# Patient Record
Sex: Female | Born: 1970 | Race: Black or African American | Hispanic: No | Marital: Single | Smoking: Never smoker
Health system: Southern US, Community
[De-identification: ages and names within clinical notes are randomized; demographics above are authoritative.]

## PROBLEM LIST (undated history)

## (undated) DIAGNOSIS — K219 Gastro-esophageal reflux disease without esophagitis: Secondary | ICD-10-CM

## (undated) DIAGNOSIS — M1990 Unspecified osteoarthritis, unspecified site: Secondary | ICD-10-CM

## (undated) HISTORY — PX: TUBAL LIGATION: SHX77

---

## 1999-10-26 ENCOUNTER — Other Ambulatory Visit: Admission: RE | Admit: 1999-10-26 | Discharge: 1999-10-26 | Payer: Self-pay | Admitting: Obstetrics

## 1999-10-26 ENCOUNTER — Encounter (INDEPENDENT_AMBULATORY_CARE_PROVIDER_SITE_OTHER): Payer: Self-pay

## 2008-11-21 ENCOUNTER — Ambulatory Visit: Payer: Self-pay | Admitting: Radiology

## 2008-11-21 ENCOUNTER — Emergency Department (HOSPITAL_BASED_OUTPATIENT_CLINIC_OR_DEPARTMENT_OTHER): Admission: EM | Admit: 2008-11-21 | Discharge: 2008-11-21 | Payer: Self-pay | Admitting: Emergency Medicine

## 2009-04-10 ENCOUNTER — Ambulatory Visit: Payer: Self-pay | Admitting: Diagnostic Radiology

## 2009-04-10 ENCOUNTER — Emergency Department (HOSPITAL_BASED_OUTPATIENT_CLINIC_OR_DEPARTMENT_OTHER): Admission: EM | Admit: 2009-04-10 | Discharge: 2009-04-10 | Payer: Self-pay | Admitting: Emergency Medicine

## 2009-04-25 ENCOUNTER — Ambulatory Visit: Payer: Self-pay | Admitting: Diagnostic Radiology

## 2009-04-25 ENCOUNTER — Emergency Department (HOSPITAL_BASED_OUTPATIENT_CLINIC_OR_DEPARTMENT_OTHER): Admission: EM | Admit: 2009-04-25 | Discharge: 2009-04-25 | Payer: Self-pay | Admitting: Emergency Medicine

## 2010-03-30 ENCOUNTER — Emergency Department (HOSPITAL_BASED_OUTPATIENT_CLINIC_OR_DEPARTMENT_OTHER)
Admission: EM | Admit: 2010-03-30 | Discharge: 2010-03-30 | Payer: Self-pay | Source: Home / Self Care | Admitting: Emergency Medicine

## 2010-03-31 LAB — GLUCOSE, CAPILLARY: Glucose-Capillary: 271 mg/dL — ABNORMAL HIGH (ref 70–99)

## 2010-05-27 LAB — LIPASE, BLOOD: Lipase: 100 U/L (ref 23–300)

## 2010-05-27 LAB — URINE MICROSCOPIC-ADD ON

## 2010-05-27 LAB — CBC
MCHC: 34.9 g/dL (ref 30.0–36.0)
MCV: 88.7 fL (ref 78.0–100.0)
WBC: 5.5 10*3/uL (ref 4.0–10.5)

## 2010-05-27 LAB — GLUCOSE, CAPILLARY: Glucose-Capillary: 309 mg/dL — ABNORMAL HIGH (ref 70–99)

## 2010-05-27 LAB — URINALYSIS, ROUTINE W REFLEX MICROSCOPIC
Glucose, UA: >1000 mg/dL — AB
Leukocytes, UA: NEGATIVE
Specific Gravity, Urine: 1.034 — ABNORMAL HIGH (ref 1.005–1.030)
Urobilinogen, UA: 0.2 mg/dL (ref 0.0–1.0)
pH: 6.5 (ref 5.0–8.0)

## 2010-05-27 LAB — COMPREHENSIVE METABOLIC PANEL
ALT: 23 U/L (ref 0–35)
BUN: 13 mg/dL (ref 6–23)
Creatinine, Ser: 0.7 mg/dL (ref 0.4–1.2)
GFR calc Af Amer: >60 mL/min (ref >60)
GFR calc non Af Amer: >60 mL/min (ref >60)
Glucose, Bld: 399 mg/dL — ABNORMAL HIGH (ref 70–99)
Potassium: 4.3 mEq/L (ref 3.5–5.1)
Sodium: 136 mEq/L (ref 135–145)

## 2010-05-27 LAB — DIFFERENTIAL
Basophils Absolute: 0.1 10*3/uL (ref 0.0–0.1)
Basophils Relative: 2 % — ABNORMAL HIGH (ref 0–1)
Monocytes Relative: 5 % (ref 3–12)
Neutro Abs: 3.3 10*3/uL (ref 1.7–7.7)

## 2010-05-27 LAB — POCT I-STAT, CHEM 8: Glucose, Bld: 336 mg/dL — ABNORMAL HIGH (ref 70–99)

## 2010-06-12 LAB — URINALYSIS, ROUTINE W REFLEX MICROSCOPIC
Nitrite: NEGATIVE
Urobilinogen, UA: 1 mg/dL (ref 0.0–1.0)

## 2010-06-12 LAB — PREGNANCY, URINE: Preg Test, Ur: NEGATIVE

## 2010-06-12 LAB — GC/CHLAMYDIA PROBE AMP, GENITAL: Chlamydia, DNA Probe: NEGATIVE

## 2010-06-12 LAB — URINE MICROSCOPIC-ADD ON

## 2010-06-12 LAB — WET PREP, GENITAL: Yeast Wet Prep HPF POC: NONE SEEN

## 2010-07-29 ENCOUNTER — Emergency Department (HOSPITAL_BASED_OUTPATIENT_CLINIC_OR_DEPARTMENT_OTHER)
Admission: EM | Admit: 2010-07-29 | Discharge: 2010-07-29 | Disposition: A | Discharge status: Home / Self Care | Payer: Medicaid Other | Attending: Emergency Medicine | Admitting: Emergency Medicine

## 2010-07-29 DIAGNOSIS — M549 Dorsalgia, unspecified: Secondary | ICD-10-CM | POA: Insufficient documentation

## 2010-07-29 DIAGNOSIS — Z79899 Other long term (current) drug therapy: Secondary | ICD-10-CM | POA: Insufficient documentation

## 2010-07-29 DIAGNOSIS — I10 Essential (primary) hypertension: Secondary | ICD-10-CM | POA: Insufficient documentation

## 2010-07-29 DIAGNOSIS — E119 Type 2 diabetes mellitus without complications: Secondary | ICD-10-CM | POA: Insufficient documentation

## 2010-07-29 DIAGNOSIS — B373 Candidiasis of vulva and vagina: Secondary | ICD-10-CM | POA: Insufficient documentation

## 2010-07-29 DIAGNOSIS — B3731 Acute candidiasis of vulva and vagina: Secondary | ICD-10-CM | POA: Insufficient documentation

## 2010-07-29 LAB — URINALYSIS, ROUTINE W REFLEX MICROSCOPIC
Bilirubin Urine: NEGATIVE
Glucose, UA: >1000 mg/dL — AB
Leukocytes, UA: NEGATIVE
Nitrite: NEGATIVE
Specific Gravity, Urine: 1.019 (ref 1.005–1.030)

## 2010-07-29 LAB — WET PREP, GENITAL
Clue Cells Wet Prep HPF POC: NONE SEEN
Trich, Wet Prep: NONE SEEN

## 2010-07-29 LAB — URINE MICROSCOPIC-ADD ON

## 2010-07-29 LAB — PREGNANCY, URINE: Preg Test, Ur: NEGATIVE

## 2010-07-30 LAB — GC/CHLAMYDIA PROBE AMP, GENITAL: Chlamydia, DNA Probe: NEGATIVE

## 2010-08-14 ENCOUNTER — Other Ambulatory Visit: Payer: Self-pay | Admitting: Obstetrics and Gynecology

## 2010-08-14 ENCOUNTER — Other Ambulatory Visit (HOSPITAL_COMMUNITY)
Admission: RE | Admit: 2010-08-14 | Discharge: 2010-08-14 | Disposition: A | Discharge status: Home / Self Care | Payer: Medicaid Other | Source: Ambulatory Visit | Attending: Obstetrics and Gynecology | Admitting: Obstetrics and Gynecology

## 2010-08-14 DIAGNOSIS — Z01419 Encounter for gynecological examination (general) (routine) without abnormal findings: Secondary | ICD-10-CM | POA: Insufficient documentation

## 2010-10-19 ENCOUNTER — Emergency Department (HOSPITAL_BASED_OUTPATIENT_CLINIC_OR_DEPARTMENT_OTHER)
Admission: EM | Admit: 2010-10-19 | Discharge: 2010-10-19 | Disposition: A | Discharge status: Home / Self Care | Payer: Medicaid Other | Attending: Emergency Medicine | Admitting: Emergency Medicine

## 2010-10-19 ENCOUNTER — Encounter: Payer: Self-pay | Admitting: *Deleted

## 2010-10-19 DIAGNOSIS — E1169 Type 2 diabetes mellitus with other specified complication: Secondary | ICD-10-CM | POA: Insufficient documentation

## 2010-10-19 DIAGNOSIS — N938 Other specified abnormal uterine and vaginal bleeding: Secondary | ICD-10-CM | POA: Insufficient documentation

## 2010-10-19 DIAGNOSIS — I10 Essential (primary) hypertension: Secondary | ICD-10-CM | POA: Insufficient documentation

## 2010-10-19 DIAGNOSIS — R1032 Left lower quadrant pain: Secondary | ICD-10-CM | POA: Insufficient documentation

## 2010-10-19 DIAGNOSIS — N949 Unspecified condition associated with female genital organs and menstrual cycle: Secondary | ICD-10-CM | POA: Insufficient documentation

## 2010-10-19 DIAGNOSIS — Z8739 Personal history of other diseases of the musculoskeletal system and connective tissue: Secondary | ICD-10-CM | POA: Insufficient documentation

## 2010-10-19 DIAGNOSIS — R739 Hyperglycemia, unspecified: Secondary | ICD-10-CM

## 2010-10-19 HISTORY — DX: Unspecified osteoarthritis, unspecified site: M19.90

## 2010-10-19 LAB — URINALYSIS, ROUTINE W REFLEX MICROSCOPIC
Glucose, UA: >1000 mg/dL — AB
Leukocytes, UA: NEGATIVE
Nitrite: NEGATIVE
Specific Gravity, Urine: 1.036 — ABNORMAL HIGH (ref 1.005–1.030)
pH: 5.5 (ref 5.0–8.0)

## 2010-10-19 LAB — PREGNANCY, URINE: Preg Test, Ur: NEGATIVE

## 2010-10-19 LAB — WET PREP, GENITAL
Clue Cells Wet Prep HPF POC: NONE SEEN
Trich, Wet Prep: NONE SEEN

## 2010-10-19 MED ORDER — METFORMIN HCL 500 MG PO TABS
500.0000 mg | ORAL_TABLET | Freq: Once | ORAL | Status: AC
  Administered 2010-10-19: 500 mg via ORAL
  Filled 2010-10-19: qty 1

## 2010-10-19 MED ORDER — METFORMIN HCL 500 MG PO TABS: 500.0000 mg | ORAL_TABLET | Freq: Two times a day (BID) | ORAL | Status: DC

## 2010-10-19 MED ORDER — HYDROCODONE-ACETAMINOPHEN 5-325 MG PO TABS: 1.0000 | ORAL_TABLET | Freq: Four times a day (QID) | ORAL | Status: DC | PRN

## 2010-10-19 NOTE — ED Notes (Signed)
Since appx Wednesday pt has had RLQ pain ans "spotting" that she states is "different than the color of her normal cycle"

## 2010-10-19 NOTE — ED Provider Notes (Addendum)
History     CSN: [id number] Arrival date & time: 10/19/2010  3:15 AM  Chief Complaint  Patient presents with  . Abdominal Pain   HPI  40 year old female presents to the emergency department from home with complaint of right lower quadrant pain and vaginal spotting. Patient reports pain in her lower abdomen starting 5 days ago, has been diffuse to the abdomen sometimes on the left sometimes on the right. Pain it is no worse today than on other days. Patient has not taken any Tylenol or Motrin today because the pain has not been that bad. Patient reports her last menstrual cycle was the end of July, and is not due for her period for another 2 weeks. Patient reports her last period was normal. Patient denies any pregnancy symptoms, reports ligation of fallopian tubes in 1997. No fever, no vomiting, patient did have an episode of loose stools on Friday but none since. Patient denies any vaginal discharge. Patient reports history of heavy bleeding and tissue passing with her periods, is followed by Dr. Chilton Si a local OB/GYN. Patient reports normal Pap smear done in May. Patient with history of hypertension and diabetes, has been out of her medications for the last month. She denies any increased thirst, has not taken her blood sugar at home in several months. Patient is eating and drinking well, though reports yesterday she is only able to eat half a sandwich at lunch.  Past Medical History  Diagnosis Date  . Diabetes mellitus   . Arthritis    hypertension  Past Surgical History  Procedure Date  . Tubal ligation     No family history on file.  History  Substance Use Topics  . Smoking status: Never Smoker   . Smokeless tobacco: Not on file  . Alcohol Use: No    OB History    Grav Para Term Preterm Abortions TAB SAB Ect Mult Living                  Review of Systems Review of systems negative other than listed in history of present illness  Physical Exam  BP 135/80  Pulse 70   Temp(Src) 98.2 F (36.8 C) (Oral)  Resp 18  SpO2 100%  LMP 09/30/2010  Physical Exam  Constitutional: She is oriented to person, place, and time. She appears well-developed and well-nourished.       Obese  HENT:  Head: Normocephalic and atraumatic.  Eyes: Conjunctivae and EOM are normal.  Neck: Normal range of motion. Neck supple.  Cardiovascular: Normal rate, regular rhythm, normal heart sounds and intact distal pulses.   Pulmonary/Chest: Effort normal and breath sounds normal.  Abdominal: Soft. She exhibits no distension and no mass. There is tenderness. There is no rebound and no guarding.       Mild tenderness diffusely across the abdomen, slightly worse on right side versus left. No suprapubic tenderness.  Genitourinary: No vaginal discharge found.       Patient noted to have normal external genitalia, moderate amount of dark blood in vault with some tissue. Os is closed. Bimanual exam shows no tenderness in the right or left adnexa. No tenderness with palpation of the bladder. Unable to fully appreciate the uterus due to body habitus unable to palpate ovaries due to body habitus.  Neurological: She is alert and oriented to person, place, and time.  Skin: Skin is warm and dry. No rash noted. No erythema. No pallor.    ED Course  Procedures Results for orders  placed during the hospital encounter of 10/19/10  GLUCOSE, CAPILLARY      Component Value Range   Glucose-Capillary 395 (*) 70 - 99 (mg/dL)   Comment 1 Notify RN    PREGNANCY, URINE      Component Value Range   Preg Test, Ur NEGATIVE    URINALYSIS, ROUTINE W REFLEX MICROSCOPIC      Component Value Range   Color, Urine YELLOW  YELLOW    Appearance CLEAR  CLEAR    Specific Gravity, Urine 1.036 (*) 1.005 - 1.030    pH 5.5  5.0 - 8.0    Glucose, UA >1000 (*) NEGATIVE (mg/dL)   Hgb urine dipstick NEGATIVE  NEGATIVE    Bilirubin Urine NEGATIVE  NEGATIVE    Ketones, ur NEGATIVE  NEGATIVE (mg/dL)   Protein, ur NEGATIVE   NEGATIVE (mg/dL)   Urobilinogen, UA 0.2  0.0 - 1.0 (mg/dL)   Nitrite NEGATIVE  NEGATIVE    Leukocytes, UA NEGATIVE  NEGATIVE   WET PREP, GENITAL      Component Value Range   Yeast, Wet Prep NONE SEEN  NONE SEEN    Trich, Wet Prep NONE SEEN  NONE SEEN    Clue Cells, Wet Prep NONE SEEN  NONE SEEN    WBC, Wet Prep HPF POC FEW (*) NONE SEEN   URINE MICROSCOPIC-ADD ON      Component Value Range   Squamous Epithelial / LPF MANY (*) RARE    WBC, UA 0-2  <3 (WBC/hpf)   RBC / HPF 0-2  <3 (RBC/hpf)   No results found.  MDM 39 year old woman with history of diabetes and hypertension who has run out of her medications with 5 days of intermittent dull pain in her abdomen. Do not feel symptoms are due to ovarian torsion, appendicitis. Feel most likely due to early menstrual cycle, dysfunctional uterine bleeding. will check urine for possible UTI/pyelonephritis, but if negative will discharge home to followup with primary care and GYN.      Olivia Mackie, MD 10/19/10 1610  Olivia Mackie, MD 10/19/10 [phone]

## 2011-05-12 ENCOUNTER — Emergency Department (HOSPITAL_BASED_OUTPATIENT_CLINIC_OR_DEPARTMENT_OTHER)
Admission: EM | Admit: 2011-05-12 | Discharge: 2011-05-12 | Disposition: A | Discharge status: Home Health | Payer: Medicaid Other | Attending: Emergency Medicine | Admitting: Emergency Medicine

## 2011-05-12 ENCOUNTER — Emergency Department (INDEPENDENT_AMBULATORY_CARE_PROVIDER_SITE_OTHER): Payer: Medicaid Other

## 2011-05-12 ENCOUNTER — Encounter (HOSPITAL_BASED_OUTPATIENT_CLINIC_OR_DEPARTMENT_OTHER): Payer: Self-pay | Admitting: Student

## 2011-05-12 DIAGNOSIS — R110 Nausea: Secondary | ICD-10-CM | POA: Insufficient documentation

## 2011-05-12 DIAGNOSIS — R42 Dizziness and giddiness: Secondary | ICD-10-CM | POA: Insufficient documentation

## 2011-05-12 DIAGNOSIS — R51 Headache: Secondary | ICD-10-CM | POA: Insufficient documentation

## 2011-05-12 DIAGNOSIS — E119 Type 2 diabetes mellitus without complications: Secondary | ICD-10-CM | POA: Insufficient documentation

## 2011-05-12 LAB — GLUCOSE, CAPILLARY: Glucose-Capillary: 138 mg/dL — ABNORMAL HIGH (ref 70–99)

## 2011-05-12 MED ORDER — SODIUM CHLORIDE 0.9 % IV BOLUS (SEPSIS)
1000.0000 mL | Freq: Once | INTRAVENOUS | Status: AC
  Administered 2011-05-12: 1000 mL via INTRAVENOUS

## 2011-05-12 MED ORDER — METOCLOPRAMIDE HCL 5 MG/ML IJ SOLN
10.0000 mg | Freq: Once | INTRAMUSCULAR | Status: AC
  Administered 2011-05-12: 10 mg via INTRAVENOUS
  Filled 2011-05-12: qty 2

## 2011-05-12 MED ORDER — DIPHENHYDRAMINE HCL 50 MG/ML IJ SOLN
25.0000 mg | Freq: Once | INTRAMUSCULAR | Status: AC
  Administered 2011-05-12: 25 mg via INTRAVENOUS
  Filled 2011-05-12: qty 1

## 2011-05-12 MED ORDER — IBUPROFEN 600 MG PO TABS: 600.0000 mg | ORAL_TABLET | Freq: Four times a day (QID) | ORAL | Status: AC | PRN

## 2011-05-12 NOTE — ED Provider Notes (Signed)
History     CSN: [id number]  Arrival date & time 05/12/11  1651   First MD Initiated Contact with Patient 05/12/11 1709      Chief Complaint  Patient presents with  . Headache    (Consider location/radiation/quality/duration/timing/severity/associated sxs/prior treatment) HPI  H/o cervical disc disease C/o R posterior headache since awakening this morning. Describes as 7/10 at this time. Worse when bending head down. Worse with blinking.  Radiates to R face. No photo or phonophobia. Has frequent headaches over the last two week x 2 per week. +nausea. No vomiting. +lightheadedness. No new changes in vision. Denies fever/chills. Describes stiffness at base of neck only. Denies numbness/tingling/weakness in arms/legs which is new.   Mom with h/o ruptured brain aneurysm.     ED Notes, ED Provider Notes from 05/12/11 0000 to 05/12/11 17:14:22       Liliane Shi, RN 05/12/2011 17:06      Pt in with c/o pressure based headache starting in back of right side of her head and radiating to right side of face behind the right eye. Sudden onset. Denies V D CP LOC SOB but reports dizziness and nausea. Gait steady, no neuro deificits noted.     Past Medical History  Diagnosis Date  . Diabetes mellitus   . Arthritis     Past Surgical History  Procedure Date  . Tubal ligation     History reviewed. No pertinent family history.  History  Substance Use Topics  . Smoking status: Never Smoker   . Smokeless tobacco: Not on file  . Alcohol Use: No    OB History    Grav Para Term Preterm Abortions TAB SAB Ect Mult Living                  Review of Systems  All other systems reviewed and are negative.   except as noted HPI   Allergies  Lidocaine  Home Medications   Current Outpatient Rx  Name Route Sig Dispense Refill  . GABAPENTIN 300 MG PO CAPS Oral Take 300 mg by mouth 3 (three) times daily.    Marland Kitchen GLIPIZIDE-METFORMIN HCL 5-500 MG PO TABS Oral Take 1 tablet by mouth 2  (two) times daily before a meal.    . LISINOPRIL 10 MG PO TABS Oral Take 10 mg by mouth daily.      Marland Kitchen METFORMIN HCL 500 MG PO TABS Oral Take 1 tablet (500 mg total) by mouth 2 (two) times daily with a meal. 30 tablet 0  . MULTIVITAMIN PO Oral Take 1 tablet by mouth daily.    Marland Kitchen PANTOPRAZOLE SODIUM 40 MG PO TBEC Oral Take 40 mg by mouth daily.    Marland Kitchen PIOGLITAZONE HCL 15 MG PO TABS Oral Take 15 mg by mouth daily.    . IBUPROFEN 600 MG PO TABS Oral Take 1 tablet (600 mg total) by mouth every 6 (six) hours as needed for pain. 30 tablet 0    BP 105/59  Pulse 69  Temp(Src) 98.7 F (37.1 C) (Oral)  Resp 16  Wt 210 lb (95.255 kg)  SpO2 100%  LMP 05/12/2011  Physical Exam  Nursing note and vitals reviewed. Constitutional: She is oriented to person, place, and time. She appears well-developed.  HENT:  Head: Atraumatic.  Mouth/Throat: Oropharynx is clear and moist.  Eyes: Conjunctivae and EOM are normal. Pupils are equal, round, and reactive to light.  Neck: Normal range of motion. Neck supple.  Cardiovascular: Normal rate, regular rhythm, normal heart  sounds and intact distal pulses.   Pulmonary/Chest: Effort normal and breath sounds normal. No respiratory distress. She has no wheezes. She has no rales.  Abdominal: Soft. She exhibits no distension. There is no tenderness. There is no rebound and no guarding.  Musculoskeletal: Normal range of motion.  Neurological: She is alert and oriented to person, place, and time. No cranial nerve deficit. She exhibits normal muscle tone. Coordination normal.       Strength 5/5 all extremities No pronator drift No facial droop   Skin: Skin is warm and dry. No rash noted.  Psychiatric: She has a normal mood and affect.    ED Course  Procedures (including critical care time)  Labs Reviewed  GLUCOSE, CAPILLARY - Abnormal; Notable for the following:    Glucose-Capillary 138 (*)    All other components within normal limits   Ct Head Wo  Contrast  05/12/2011  *RADIOLOGY REPORT*  Clinical Data: Posterior headache and dizziness.  CT HEAD WITHOUT CONTRAST  Technique:  Contiguous axial images were obtained from the base of the skull through the vertex without contrast.  Comparison: None.  Findings: There is no acute intracranial hemorrhage, infarction, or mass lesion.  Brain parenchyma is normal.  Osseous structures are normal.  IMPRESSION: Normal exam.  Original Report Authenticated By: Gwynn Burly, M.D.    1. Headache     MDM  P/W headache, worst of life. Mother with h/o brain aneurysm. Headache improved after reglan, benadryl, IVF to 3/10 at this time. Neurologically intact. CT head unremarkable. Discussed with patient LP and patient is undecided if she would like to proceed with that procedure at this time. Will reassess.  Patient declining LP at this time. She is alert, oriented, and competent. Understands risks of leaving and inability to diagnose 1% SAH with negative CT head within 24 hours. Pain 2/10 at this time after the above interventions. Will discharge home with PMD f/u. Strict precautions for return.       Forbes Cellar, MD 05/12/11 [phone]

## 2011-05-12 NOTE — Discharge Instructions (Signed)

## 2011-05-12 NOTE — ED Notes (Signed)
Pt in with c/o pressure based headache starting in back of right side of her head and radiating to right side of face behind the right eye. Sudden onset. Denies  V D CP LOC SOB but reports dizziness and nausea. Gait steady, no neuro deificits noted.

## 2011-07-17 ENCOUNTER — Emergency Department (INDEPENDENT_AMBULATORY_CARE_PROVIDER_SITE_OTHER): Payer: Medicaid Other

## 2011-07-17 ENCOUNTER — Emergency Department (HOSPITAL_BASED_OUTPATIENT_CLINIC_OR_DEPARTMENT_OTHER)
Admission: EM | Admit: 2011-07-17 | Discharge: 2011-07-17 | Disposition: A | Discharge status: Home / Self Care | Payer: Medicaid Other | Attending: Emergency Medicine | Admitting: Emergency Medicine

## 2011-07-17 ENCOUNTER — Encounter (HOSPITAL_BASED_OUTPATIENT_CLINIC_OR_DEPARTMENT_OTHER): Payer: Self-pay | Admitting: Emergency Medicine

## 2011-07-17 DIAGNOSIS — J029 Acute pharyngitis, unspecified: Secondary | ICD-10-CM | POA: Insufficient documentation

## 2011-07-17 DIAGNOSIS — E119 Type 2 diabetes mellitus without complications: Secondary | ICD-10-CM | POA: Insufficient documentation

## 2011-07-17 DIAGNOSIS — J069 Acute upper respiratory infection, unspecified: Secondary | ICD-10-CM

## 2011-07-17 DIAGNOSIS — R05 Cough: Secondary | ICD-10-CM | POA: Insufficient documentation

## 2011-07-17 DIAGNOSIS — R509 Fever, unspecified: Secondary | ICD-10-CM | POA: Insufficient documentation

## 2011-07-17 DIAGNOSIS — R0602 Shortness of breath: Secondary | ICD-10-CM | POA: Insufficient documentation

## 2011-07-17 DIAGNOSIS — R059 Cough, unspecified: Secondary | ICD-10-CM | POA: Insufficient documentation

## 2011-07-17 DIAGNOSIS — R51 Headache: Secondary | ICD-10-CM

## 2011-07-17 DIAGNOSIS — K219 Gastro-esophageal reflux disease without esophagitis: Secondary | ICD-10-CM | POA: Insufficient documentation

## 2011-07-17 DIAGNOSIS — J3489 Other specified disorders of nose and nasal sinuses: Secondary | ICD-10-CM | POA: Insufficient documentation

## 2011-07-17 HISTORY — DX: Gastro-esophageal reflux disease without esophagitis: K21.9

## 2011-07-17 MED ORDER — ACETAMINOPHEN 325 MG PO TABS
ORAL_TABLET | ORAL | Status: AC
  Filled 2011-07-17: qty 2

## 2011-07-17 MED ORDER — OXYMETAZOLINE HCL 0.05 % NA SOLN
1.0000 | Freq: Once | NASAL | Status: AC
  Administered 2011-07-17: 1 via NASAL
  Filled 2011-07-17: qty 15

## 2011-07-17 MED ORDER — ACETAMINOPHEN 325 MG PO TABS
650.0000 mg | ORAL_TABLET | Freq: Once | ORAL | Status: AC
  Administered 2011-07-17: 650 mg via ORAL

## 2011-07-17 MED ORDER — ALBUTEROL SULFATE HFA 108 (90 BASE) MCG/ACT IN AERS
2.0000 | INHALATION_SPRAY | RESPIRATORY_TRACT | Status: DC | PRN
  Administered 2011-07-17: 2 via RESPIRATORY_TRACT
  Filled 2011-07-17: qty 6.7

## 2011-07-17 NOTE — ED Provider Notes (Addendum)
History     CSN: [id number]  Arrival date & time 07/17/11  0045   First MD Initiated Contact with Patient 07/17/11 0107      Chief Complaint  Patient presents with  . Sore Throat    (Consider location/radiation/quality/duration/timing/severity/associated sxs/prior treatment) Patient is a 41 y.o. female presenting with pharyngitis. The history is provided by the patient.  Sore Throat This is a new problem. Episode onset: 5 days ago. The problem occurs constantly. The problem has not changed since onset.Associated symptoms include shortness of breath. Pertinent negatives include no chest pain and no abdominal pain. Associated symptoms comments: Cough, fever and congestion . The symptoms are aggravated by swallowing. The symptoms are relieved by nothing. Treatments tried: OTC meds. The treatment provided no relief.    Past Medical History  Diagnosis Date  . Diabetes mellitus   . Arthritis   . Gastro-esophageal reflux     Past Surgical History  Procedure Date  . Tubal ligation     No family history on file.  History  Substance Use Topics  . Smoking status: Never Smoker   . Smokeless tobacco: Not on file  . Alcohol Use: No    OB History    Grav Para Term Preterm Abortions TAB SAB Ect Mult Living                  Review of Systems  Constitutional: Positive for fever and chills.  HENT: Positive for congestion, sore throat, rhinorrhea and sinus pressure. Negative for trouble swallowing and voice change.   Respiratory: Positive for cough, shortness of breath and wheezing.   Cardiovascular: Negative for chest pain.  Gastrointestinal: Negative for nausea and abdominal pain.  All other systems reviewed and are negative.    Allergies  Lidocaine  Home Medications   Current Outpatient Rx  Name Route Sig Dispense Refill  . GABAPENTIN 300 MG PO CAPS Oral Take 300 mg by mouth 3 (three) times daily.    Marland Kitchen GLIPIZIDE-METFORMIN HCL 5-500 MG PO TABS Oral Take 1 tablet by  mouth 2 (two) times daily before a meal.    . LISINOPRIL 10 MG PO TABS Oral Take 10 mg by mouth daily.      Marland Kitchen METFORMIN HCL 500 MG PO TABS Oral Take 1 tablet (500 mg total) by mouth 2 (two) times daily with a meal. 30 tablet 0  . MULTIVITAMIN PO Oral Take 1 tablet by mouth daily.    Marland Kitchen PANTOPRAZOLE SODIUM 40 MG PO TBEC Oral Take 40 mg by mouth daily.      BP 141/77  Pulse 94  Temp(Src) 99.3 F (37.4 C) (Oral)  Resp 22  Ht 5\' 5"  (1.651 m)  Wt 209 lb (94.802 kg)  BMI 34.78 kg/m2  SpO2 100%  LMP 07/09/2011  Physical Exam  Nursing note and vitals reviewed. Constitutional: She is oriented to person, place, and time. She appears well-developed and well-nourished. No distress.  HENT:  Head: Normocephalic and atraumatic.  Right Ear: Tympanic membrane normal.  Left Ear: Tympanic membrane normal.  Nose: Mucosal edema and rhinorrhea present.  Mouth/Throat: Posterior oropharyngeal erythema present. No oropharyngeal exudate or posterior oropharyngeal edema.       Cobblestoning in the back of pharynx consistent with drainage  Eyes: EOM are normal. Pupils are equal, round, and reactive to light.  Neck: Normal range of motion. Neck supple.  Cardiovascular: Normal rate, regular rhythm, normal heart sounds and intact distal pulses.  Exam reveals no friction rub.   No murmur heard.  Pulmonary/Chest: Effort normal and breath sounds normal. She has no wheezes. She has no rales.  Abdominal: Soft. Bowel sounds are normal. She exhibits no distension. There is no tenderness. There is no rebound and no guarding.  Musculoskeletal: Normal range of motion. She exhibits no tenderness.       No edema  Lymphadenopathy:    She has no cervical adenopathy.  Neurological: She is alert and oriented to person, place, and time. No cranial nerve deficit.  Skin: Skin is warm and dry. No rash noted.  Psychiatric: She has a normal mood and affect. Her behavior is normal.    ED Course  Procedures (including critical  care time)  Labs Reviewed - No data to display Dg Chest 2 View  07/17/2011  *RADIOLOGY REPORT*  Clinical Data: Sore throat, headache, productive cough.  CHEST - 2 VIEW  Comparison: 04/10/2009  Findings: Shallow inspiration.  Normal heart size and pulmonary vascularity.  No focal airspace consolidation in the lungs.  No blunting of costophrenic angles.  No pneumothorax. Mild degenerative changes in the thoracic spine.  No significant changes since the previous study.  IMPRESSION: No evidence of active pulmonary disease.  Original Report Authenticated By: Marlon Pel, M.D.     1. URI (upper respiratory infection)       MDM   Pt with symptoms consistent with viral URI.  Well appearing here.  No signs of breathing difficulty  No signs of pharyngitis, otitis or abnormal abdominal findings.  However pt states the productive cough is so bad at times it causes SOB.  No wheezing on exam today and no hx of smoking or asthma. CXR wnl and pt to return with any further problems.         Gwyneth Sprout, MD 07/17/11 [id number]  Gwyneth Sprout, MD 07/17/11 6295  Gwyneth Sprout, MD 07/17/11 2841

## 2011-07-17 NOTE — ED Notes (Signed)
sorethroat x5 days onset x 2 days of yellow sputum,headache,cough

## 2011-09-07 ENCOUNTER — Other Ambulatory Visit: Payer: Self-pay | Admitting: Obstetrics and Gynecology

## 2011-09-07 ENCOUNTER — Other Ambulatory Visit (HOSPITAL_COMMUNITY)
Admission: RE | Admit: 2011-09-07 | Discharge: 2011-09-07 | Disposition: A | Discharge status: Home / Self Care | Payer: Medicaid Other | Source: Ambulatory Visit | Attending: Obstetrics and Gynecology | Admitting: Obstetrics and Gynecology

## 2011-09-07 DIAGNOSIS — Z1231 Encounter for screening mammogram for malignant neoplasm of breast: Secondary | ICD-10-CM

## 2011-09-07 DIAGNOSIS — Z124 Encounter for screening for malignant neoplasm of cervix: Secondary | ICD-10-CM | POA: Insufficient documentation

## 2011-09-08 ENCOUNTER — Ambulatory Visit (HOSPITAL_BASED_OUTPATIENT_CLINIC_OR_DEPARTMENT_OTHER)
Admission: RE | Admit: 2011-09-08 | Discharge: 2011-09-08 | Disposition: A | Discharge status: Home / Self Care | Payer: Medicaid Other | Source: Ambulatory Visit | Attending: Obstetrics and Gynecology | Admitting: Obstetrics and Gynecology

## 2011-09-08 DIAGNOSIS — Z1231 Encounter for screening mammogram for malignant neoplasm of breast: Secondary | ICD-10-CM | POA: Insufficient documentation

## 2012-05-08 ENCOUNTER — Emergency Department (HOSPITAL_BASED_OUTPATIENT_CLINIC_OR_DEPARTMENT_OTHER)
Admission: EM | Admit: 2012-05-08 | Discharge: 2012-05-09 | Disposition: A | Discharge status: Home / Self Care | Payer: Medicaid Other | Attending: Emergency Medicine | Admitting: Emergency Medicine

## 2012-05-08 DIAGNOSIS — Z79899 Other long term (current) drug therapy: Secondary | ICD-10-CM | POA: Insufficient documentation

## 2012-05-08 DIAGNOSIS — K219 Gastro-esophageal reflux disease without esophagitis: Secondary | ICD-10-CM | POA: Insufficient documentation

## 2012-05-08 DIAGNOSIS — T783XXA Angioneurotic edema, initial encounter: Secondary | ICD-10-CM | POA: Insufficient documentation

## 2012-05-08 DIAGNOSIS — E119 Type 2 diabetes mellitus without complications: Secondary | ICD-10-CM | POA: Insufficient documentation

## 2012-05-08 DIAGNOSIS — T464X5A Adverse effect of angiotensin-converting-enzyme inhibitors, initial encounter: Secondary | ICD-10-CM

## 2012-05-08 DIAGNOSIS — T498X5A Adverse effect of other topical agents, initial encounter: Secondary | ICD-10-CM | POA: Insufficient documentation

## 2012-05-08 DIAGNOSIS — M129 Arthropathy, unspecified: Secondary | ICD-10-CM | POA: Insufficient documentation

## 2012-05-08 DIAGNOSIS — Z791 Long term (current) use of non-steroidal anti-inflammatories (NSAID): Secondary | ICD-10-CM | POA: Insufficient documentation

## 2012-05-08 DIAGNOSIS — R220 Localized swelling, mass and lump, head: Secondary | ICD-10-CM | POA: Insufficient documentation

## 2012-05-09 ENCOUNTER — Encounter (HOSPITAL_BASED_OUTPATIENT_CLINIC_OR_DEPARTMENT_OTHER): Payer: Self-pay | Admitting: *Deleted

## 2012-05-09 MED ORDER — PREDNISONE 20 MG PO TABS
40.0000 mg | ORAL_TABLET | Freq: Once | ORAL | Status: AC
  Administered 2012-05-09: 40 mg via ORAL
  Filled 2012-05-09: qty 2

## 2012-05-09 MED ORDER — PREDNISONE 10 MG PO TABS: 20.0000 mg | ORAL_TABLET | Freq: Two times a day (BID) | ORAL | Status: DC

## 2012-05-09 NOTE — ED Provider Notes (Signed)
History     CSN: [id number]  Arrival date & time 05/08/12  2345   First MD Initiated Contact with Patient 05/09/12 0010      Chief Complaint  Patient presents with  . Pruritis    (Consider location/radiation/quality/duration/timing/severity/associated sxs/prior treatment) HPI Comments: Patient with 12 hour history of swelling of the lips and itching all over.  She denies any new exposures or contacts.  She is on an ace inhibitor which was recently re-started after she had been on it for a while.  No airway problems or difficulty breathing.  No rash or hives.  No chest pain or shortness of breath.  No other aggravating or alleviating factors.  The history is provided by the patient.    Past Medical History  Diagnosis Date  . Diabetes mellitus   . Arthritis   . Gastro-esophageal reflux     Past Surgical History  Procedure Laterality Date  . Tubal ligation      History reviewed. No pertinent family history.  History  Substance Use Topics  . Smoking status: Never Smoker   . Smokeless tobacco: Not on file  . Alcohol Use: No    OB History   Grav Para Term Preterm Abortions TAB SAB Ect Mult Living                  Review of Systems  HENT:       Lip swelling   Skin:       itching  All other systems reviewed and are negative.    Allergies  Lidocaine  Home Medications   Current Outpatient Rx  Name  Route  Sig  Dispense  Refill  . carisoprodol (SOMA) 350 MG tablet   Oral   Take 350 mg by mouth 3 (three) times daily as needed.         . gabapentin (NEURONTIN) 300 MG capsule   Oral   Take 300 mg by mouth 2 (two) times daily before lunch and supper.          Marland Kitchen glipiZIDE-metformin (METAGLIP) 5-500 MG per tablet   Oral   Take 2 tablets by mouth 2 (two) times daily before a meal.          . lisinopril (PRINIVIL,ZESTRIL) 10 MG tablet   Oral   Take 5 mg by mouth daily.          Marland Kitchen EXPIRED: metFORMIN (GLUCOPHAGE) 500 MG tablet   Oral   Take 1 tablet  (500 mg total) by mouth 2 (two) times daily with a meal.   30 tablet   0   . metoprolol succinate (TOPROL-XL) 25 MG 24 hr tablet   Oral   Take 25 mg by mouth daily.         . Multiple Vitamins-Minerals (MULTIVITAMIN PO)   Oral   Take 1 tablet by mouth daily.         . naproxen (NAPROSYN) 500 MG tablet   Oral   Take 500 mg by mouth 2 (two) times daily with a meal.         . nortriptyline (PAMELOR) 25 MG capsule   Oral   Take 25 mg by mouth at bedtime.         . pantoprazole (PROTONIX) 40 MG tablet   Oral   Take 40 mg by mouth daily.           BP 132/83  Pulse 83  Temp(Src) 97.9 F (36.6 C) (Oral)  Resp 20  Ht  5\' 6"  (1.676 m)  Wt 215 lb (97.523 kg)  BMI 34.72 kg/m2  SpO2 100%  Physical Exam  Nursing note and vitals reviewed. Constitutional: She is oriented to person, place, and time. She appears well-developed and well-nourished. No distress.  HENT:  Head: Normocephalic and atraumatic.  Mouth/Throat: Oropharynx is clear and moist.  There is mild swelling of both upper and lower lips.  No tongue swelling or difficulty swallowing.  Neck: Normal range of motion. Neck supple.  Cardiovascular: Normal rate and regular rhythm.   No murmur heard. Pulmonary/Chest: Effort normal and breath sounds normal. No respiratory distress.  No stridor.  Abdominal: Soft. Bowel sounds are normal.  Musculoskeletal: Normal range of motion. She exhibits no edema.  Neurological: She is alert and oriented to person, place, and time.  Skin: Skin is warm and dry. She is not diaphoretic.    ED Course  Procedures (including critical care time)  Labs Reviewed - No data to display No results found.   No diagnosis found.    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  Will treat with steroids and antihistamines.  She is also to hold her ace inhibitor and track her blood pressures over the next week.  She has an appointment with her pcp in 4 days and can discuss her  medications then.        Geoffery Lyons, MD 05/09/12 [phone]

## 2012-05-09 NOTE — ED Notes (Signed)
Onset of itching this am  Lip started swelling tonight.  Denies difficulty swallowing or breathing

## 2012-08-29 ENCOUNTER — Ambulatory Visit: Payer: Self-pay | Admitting: Podiatry

## 2012-09-05 ENCOUNTER — Ambulatory Visit (INDEPENDENT_AMBULATORY_CARE_PROVIDER_SITE_OTHER): Payer: Self-pay | Admitting: Podiatry

## 2012-09-05 DIAGNOSIS — B351 Tinea unguium: Secondary | ICD-10-CM | POA: Insufficient documentation

## 2012-09-05 DIAGNOSIS — M79609 Pain in unspecified limb: Secondary | ICD-10-CM

## 2012-09-05 NOTE — Progress Notes (Signed)
42 year old NIDDM presents for diabetic foot care. Blood sugar has been good and has reduced her medication.  Stated that she has used antifungal drops for a few months and now using OTC drops.  Objective: Dystrophic nail and hypertrophic on both great toe nails. No other deformity. All pedal pulses palpable. No skin lesions.  Assessment: Onychomycosis both great toe nails. NIDDM.  Plan: Continue using antifungal drops. Debrided all nails.

## 2012-12-13 ENCOUNTER — Ambulatory Visit (INDEPENDENT_AMBULATORY_CARE_PROVIDER_SITE_OTHER): Payer: Medicaid Other | Admitting: Podiatry

## 2012-12-13 ENCOUNTER — Encounter: Payer: Self-pay | Admitting: Podiatry

## 2012-12-13 VITALS — BP 110/63 | HR 71 | Ht 66.0 in | Wt 198.0 lb

## 2012-12-13 DIAGNOSIS — M79609 Pain in unspecified limb: Secondary | ICD-10-CM

## 2012-12-13 DIAGNOSIS — B351 Tinea unguium: Secondary | ICD-10-CM

## 2012-12-13 DIAGNOSIS — M79673 Pain in unspecified foot: Secondary | ICD-10-CM | POA: Insufficient documentation

## 2012-12-13 MED ORDER — TERBINAFINE HCL 250 MG PO TABS: 250.0000 mg | ORAL_TABLET | Freq: Every day | ORAL | Status: DC

## 2012-12-13 NOTE — Progress Notes (Signed)
Subjective: 42 year old NIDDM presents for diabetic foot care.  Stated that she has used antifungal drops for a few months and now using OTC drops. She has not seen any changes in her nails.  Objective: Dystrophic nail and hypertrophic on both great toe nails.  No other deformity.  All pedal pulses palpable.  No skin lesions.   Assessment: Onychomycosis both great toe nails.  NIDDM.   Plan: Will try oral antifungal medications. Debrided all nails.

## 2012-12-13 NOTE — Patient Instructions (Signed)
Seen for fungal nails. Seen no improvement or change in nails after OTC topical. Will try oral medication. Please do blood work as soon as start taking the pills.

## 2013-02-16 IMAGING — CR DG CHEST 2V
2 series · 2 of 2 positions shown · non-contrast
Comparison: 04/10/2009

CLINICAL DATA: Sore throat, headache, productive cough.

CHEST - 2 VIEW

[w chest pa]
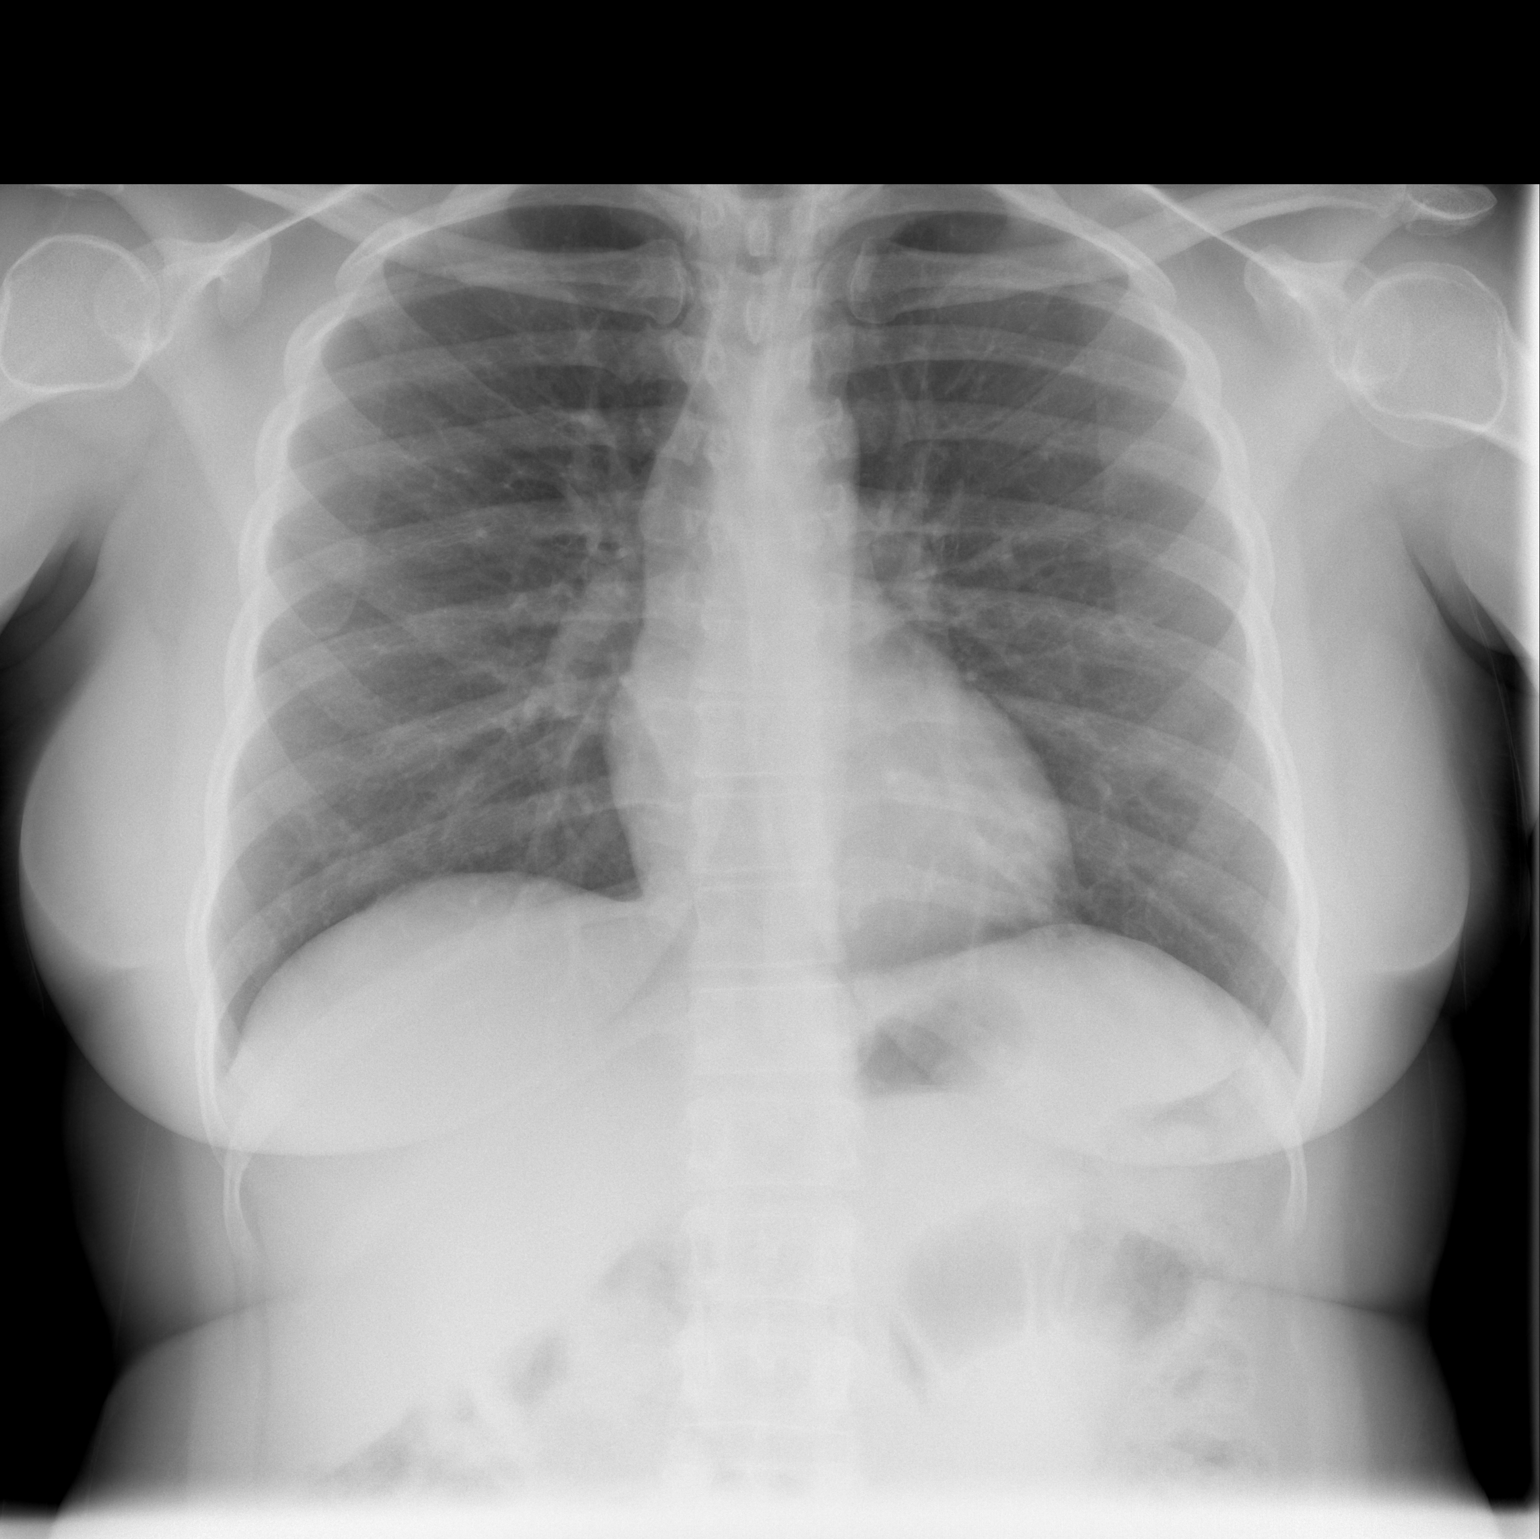

[w chest lat]
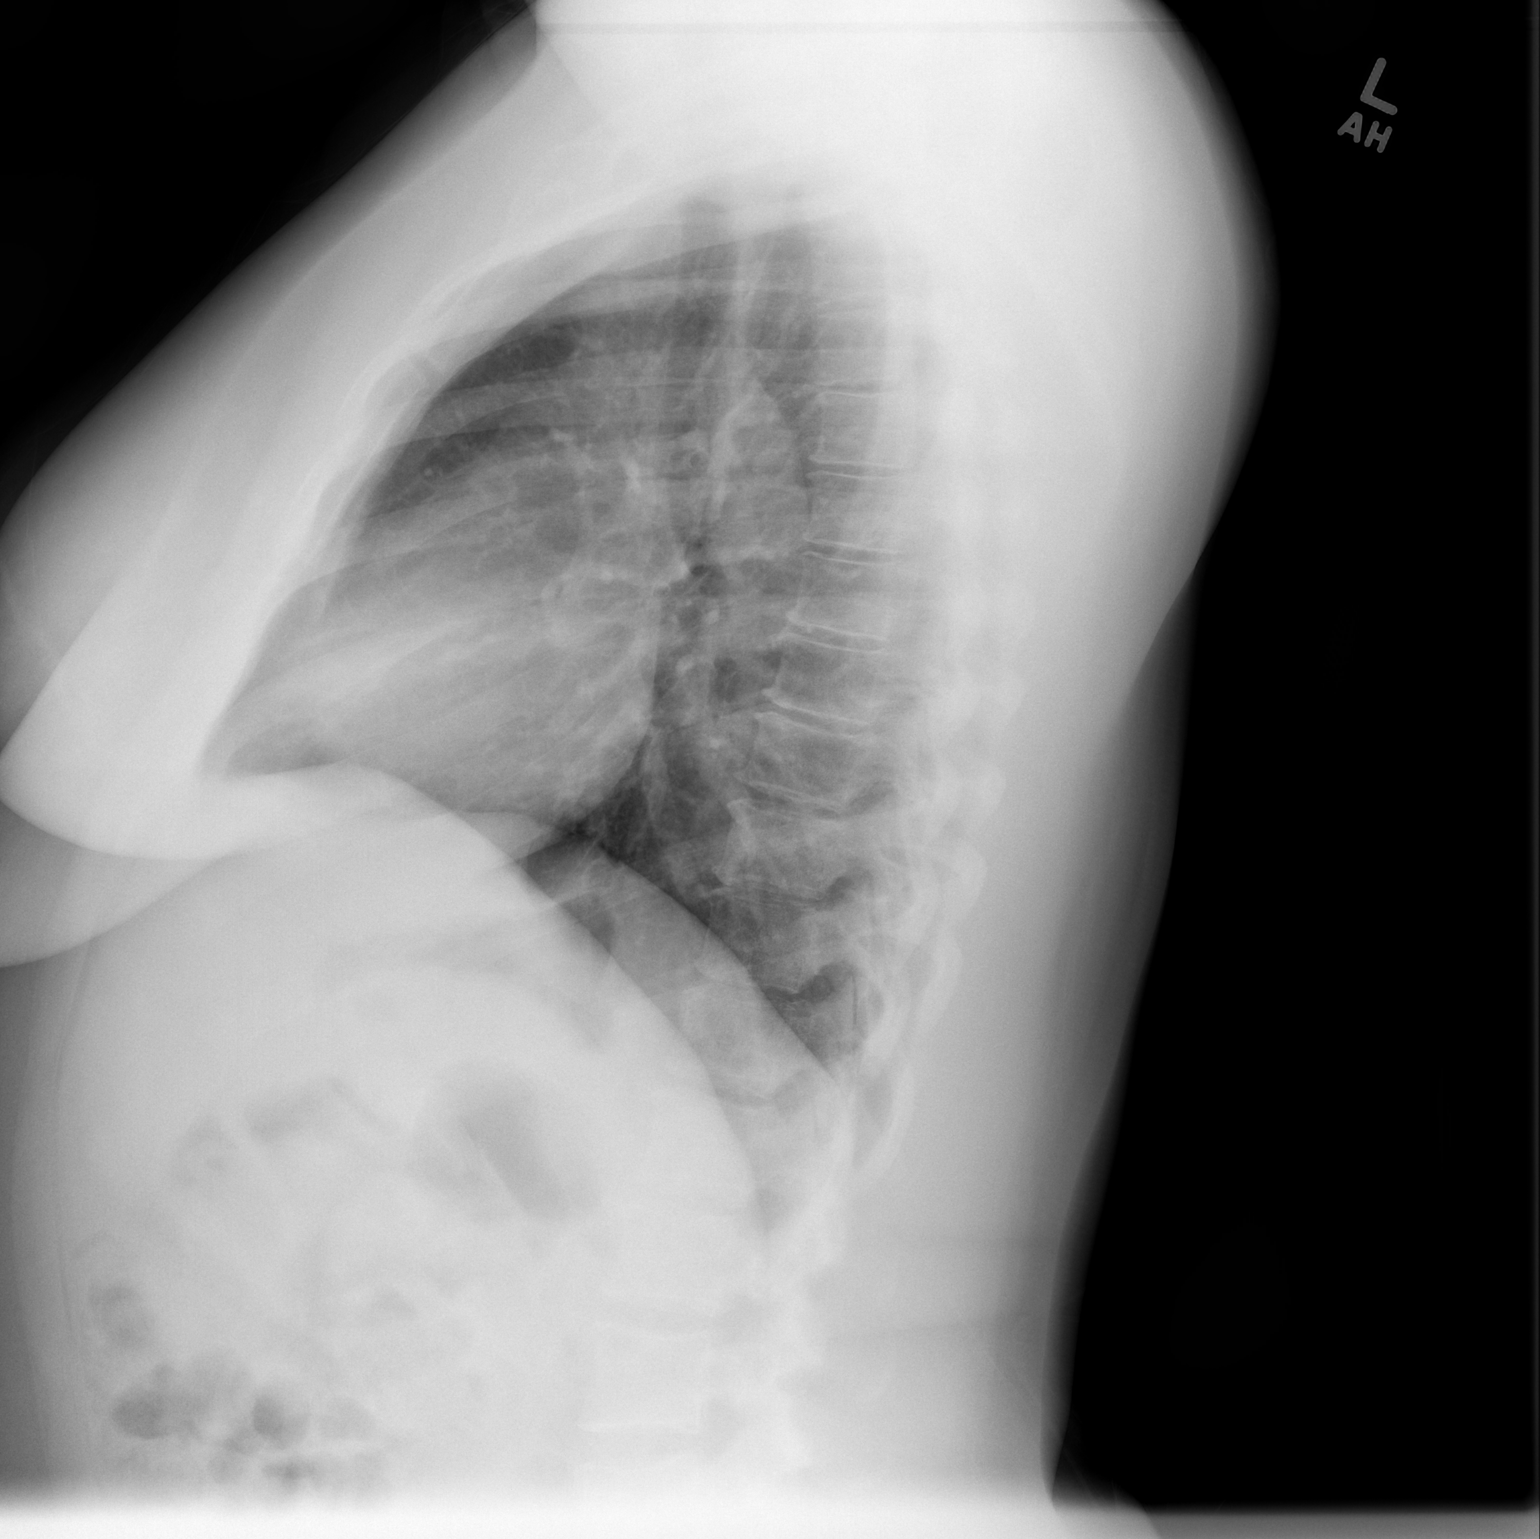

[2 of 2 positions shown; findings below may reference images not displayed]

FINDINGS: Shallow inspiration.  Normal heart size and pulmonary
vascularity.  No focal airspace consolidation in the lungs.  No
blunting of costophrenic angles.  No pneumothorax. Mild
degenerative changes in the thoracic spine.  No significant changes
since the previous study.
IMPRESSION: No evidence of active pulmonary disease.

## 2013-03-16 ENCOUNTER — Encounter: Payer: Self-pay | Admitting: Podiatry

## 2013-03-16 ENCOUNTER — Ambulatory Visit (INDEPENDENT_AMBULATORY_CARE_PROVIDER_SITE_OTHER): Payer: Medicaid Other | Admitting: Podiatry

## 2013-03-16 VITALS — BP 130/74 | HR 75

## 2013-03-16 DIAGNOSIS — M79609 Pain in unspecified limb: Secondary | ICD-10-CM

## 2013-03-16 DIAGNOSIS — B351 Tinea unguium: Secondary | ICD-10-CM

## 2013-03-16 NOTE — Patient Instructions (Signed)
Seen for hypertrophic nails. Continue Lamisil for another month. Will start on Topical next month.  All nails debrided. Return in 2 months.

## 2013-03-16 NOTE — Progress Notes (Signed)
Subjective:  43 year old NIDDM presents for diabetic foot care.  Has taken Lamisil last 2 months.  Stated that she has used antifungal drops for a few months and now using OTC drops. She has not seen any changes in her nails.  Objective: Dystrophic nail and hypertrophic on both great toe nails.  No other deformity.  All pedal pulses palpable.  No skin lesions.  Assessment: Onychomycosis both great toe nails.  NIDDM.  Plan: Will follow with topical antifungal medication when finished with Lamisil next month.   Debrided all nails

## 2013-04-10 ENCOUNTER — Other Ambulatory Visit: Payer: Self-pay | Admitting: Podiatry

## 2013-05-14 ENCOUNTER — Ambulatory Visit (INDEPENDENT_AMBULATORY_CARE_PROVIDER_SITE_OTHER): Payer: Medicaid Other | Admitting: Podiatry

## 2013-05-14 ENCOUNTER — Encounter: Payer: Self-pay | Admitting: Podiatry

## 2013-05-14 VITALS — BP 122/79 | HR 75

## 2013-05-14 DIAGNOSIS — M79673 Pain in unspecified foot: Secondary | ICD-10-CM

## 2013-05-14 DIAGNOSIS — B351 Tinea unguium: Secondary | ICD-10-CM

## 2013-05-14 DIAGNOSIS — M79609 Pain in unspecified limb: Secondary | ICD-10-CM

## 2013-05-14 NOTE — Progress Notes (Signed)
Been on Lamisil for one month. Sees nails are improving with treatment.   Assessment: Mycotic nails x 10.   Plan:  Blood drawn for liver enzyme profile.  Nails debrided. Return in 6 weeks.

## 2013-05-14 NOTE — Patient Instructions (Signed)
Follow up on fungal nail. Improving nail. Continue to take Lamisil for 2 more months.  Will inform you if blood work is abnormal.  Return in 6 weeks.

## 2013-05-15 ENCOUNTER — Other Ambulatory Visit: Payer: Self-pay | Admitting: Podiatry

## 2013-06-25 ENCOUNTER — Ambulatory Visit (INDEPENDENT_AMBULATORY_CARE_PROVIDER_SITE_OTHER): Payer: Medicaid Other | Admitting: Podiatry

## 2013-06-25 ENCOUNTER — Encounter: Payer: Self-pay | Admitting: Podiatry

## 2013-06-25 VITALS — BP 133/95 | HR 83

## 2013-06-25 DIAGNOSIS — M79673 Pain in unspecified foot: Secondary | ICD-10-CM

## 2013-06-25 DIAGNOSIS — B351 Tinea unguium: Secondary | ICD-10-CM

## 2013-06-25 DIAGNOSIS — M79609 Pain in unspecified limb: Secondary | ICD-10-CM

## 2013-06-25 NOTE — Patient Instructions (Signed)
Blood work repeated for pre medication, Lamisil.

## 2013-06-25 NOTE — Progress Notes (Signed)
Follow up on fungal nail treatment. She is taking Lamisil 250 mg daily. She feels she can tell improvement. Nails are mildly hypertrophic. Today blood work repeated for Liver profile.  Blood drawn to repeat liver enzyme level.  Will debride nails next visit.

## 2013-06-28 ENCOUNTER — Other Ambulatory Visit: Payer: Self-pay | Admitting: *Deleted

## 2013-07-23 ENCOUNTER — Other Ambulatory Visit: Payer: Self-pay | Admitting: *Deleted

## 2013-07-25 ENCOUNTER — Ambulatory Visit (INDEPENDENT_AMBULATORY_CARE_PROVIDER_SITE_OTHER): Payer: Medicaid Other | Admitting: Podiatry

## 2013-07-25 ENCOUNTER — Encounter: Payer: Self-pay | Admitting: Podiatry

## 2013-07-25 VITALS — BP 130/74 | HR 89

## 2013-07-25 DIAGNOSIS — M79609 Pain in unspecified limb: Secondary | ICD-10-CM

## 2013-07-25 DIAGNOSIS — B351 Tinea unguium: Secondary | ICD-10-CM

## 2013-07-25 DIAGNOSIS — M79673 Pain in unspecified foot: Secondary | ICD-10-CM

## 2013-07-25 NOTE — Progress Notes (Signed)
Subjective:  43 year old female presents for follow up on fungal nail treatment.  Still taking Lamisil. Almost done with 3 months course. Patient acknowledge improved toe nails.   Objective: Improving nail plate evident in both great toe nails showing clear thin new nail growth at proximal 3/4. No other problems or issues at this time.  Assessment: Improving onychomycosis with Lamisil PO treatment. Distal 1/4 of nail plate thick with yellow debris on both great toes.  Plan: All nails debrided. Instructed to finish Lamisil and follow with OTC antifungal drops.  Return in 3 month.

## 2013-07-25 NOTE — Patient Instructions (Signed)
Improved fungal nail with Lamisil. Finish Lamisil and follow with Topical antifungal medication. Return in 3 months.

## 2013-10-26 ENCOUNTER — Ambulatory Visit: Payer: Medicaid Other | Admitting: Podiatry

## 2013-11-09 ENCOUNTER — Emergency Department (HOSPITAL_BASED_OUTPATIENT_CLINIC_OR_DEPARTMENT_OTHER)
Admission: EM | Admit: 2013-11-09 | Discharge: 2013-11-09 | Disposition: A | Discharge status: Home / Self Care | Payer: Medicaid Other | Attending: Emergency Medicine | Admitting: Emergency Medicine

## 2013-11-09 ENCOUNTER — Encounter (HOSPITAL_BASED_OUTPATIENT_CLINIC_OR_DEPARTMENT_OTHER): Payer: Self-pay | Admitting: Emergency Medicine

## 2013-11-09 DIAGNOSIS — Z3202 Encounter for pregnancy test, result negative: Secondary | ICD-10-CM | POA: Insufficient documentation

## 2013-11-09 DIAGNOSIS — R109 Unspecified abdominal pain: Secondary | ICD-10-CM | POA: Diagnosis present

## 2013-11-09 DIAGNOSIS — Z9851 Tubal ligation status: Secondary | ICD-10-CM | POA: Diagnosis not present

## 2013-11-09 DIAGNOSIS — K219 Gastro-esophageal reflux disease without esophagitis: Secondary | ICD-10-CM | POA: Diagnosis not present

## 2013-11-09 DIAGNOSIS — Z8739 Personal history of other diseases of the musculoskeletal system and connective tissue: Secondary | ICD-10-CM | POA: Insufficient documentation

## 2013-11-09 DIAGNOSIS — Z79899 Other long term (current) drug therapy: Secondary | ICD-10-CM | POA: Insufficient documentation

## 2013-11-09 DIAGNOSIS — E119 Type 2 diabetes mellitus without complications: Secondary | ICD-10-CM | POA: Diagnosis not present

## 2013-11-09 LAB — CBC WITH DIFFERENTIAL/PLATELET
Basophils Absolute: 0 10*3/uL (ref 0.0–0.1)
Basophils Relative: 0 % (ref 0–1)
Eosinophils Absolute: 0.1 10*3/uL (ref 0.0–0.7)
Eosinophils Relative: 1 % (ref 0–5)
HCT: 37.5 % (ref 36.0–46.0)
HEMOGLOBIN: 12.9 g/dL (ref 12.0–15.0)
LYMPHS ABS: 2.5 10*3/uL (ref 0.7–4.0)
LYMPHS PCT: 34 % (ref 12–46)
MCH: 30 pg (ref 26.0–34.0)
MCHC: 34.4 g/dL (ref 30.0–36.0)
MCV: 87.2 fL (ref 78.0–100.0)
MONOS PCT: 6 % (ref 3–12)
Monocytes Absolute: 0.4 10*3/uL (ref 0.1–1.0)
NEUTROS ABS: 4.4 10*3/uL (ref 1.7–7.7)
NEUTROS PCT: 59 % (ref 43–77)
PLATELETS: 306 10*3/uL (ref 150–400)
RBC: 4.3 MIL/uL (ref 3.87–5.11)
RDW: 12 % (ref 11.5–15.5)
WBC: 7.4 10*3/uL (ref 4.0–10.5)

## 2013-11-09 LAB — URINALYSIS, ROUTINE W REFLEX MICROSCOPIC
BILIRUBIN URINE: NEGATIVE
GLUCOSE, UA: 100 mg/dL — AB
Ketones, ur: NEGATIVE mg/dL
Leukocytes, UA: NEGATIVE
Nitrite: NEGATIVE
PH: 6.5 (ref 5.0–8.0)
Protein, ur: NEGATIVE mg/dL
SPECIFIC GRAVITY, URINE: 1.01 (ref 1.005–1.030)
Urobilinogen, UA: 0.2 mg/dL (ref 0.0–1.0)

## 2013-11-09 LAB — COMPREHENSIVE METABOLIC PANEL
ALBUMIN: 4 g/dL (ref 3.5–5.2)
ALK PHOS: 68 U/L (ref 39–117)
ALT: 11 U/L (ref 0–35)
AST: 17 U/L (ref 0–37)
Anion gap: 13 (ref 5–15)
BUN: 13 mg/dL (ref 6–23)
CHLORIDE: 100 meq/L (ref 96–112)
CO2: 26 mEq/L (ref 19–32)
Calcium: 11.1 mg/dL — ABNORMAL HIGH (ref 8.4–10.5)
Creatinine, Ser: 0.7 mg/dL (ref 0.50–1.10)
GFR calc Af Amer: >90 mL/min (ref >90)
GFR calc non Af Amer: >90 mL/min (ref >90)
Glucose, Bld: 217 mg/dL — ABNORMAL HIGH (ref 70–99)
POTASSIUM: 4.4 meq/L (ref 3.7–5.3)
Sodium: 139 mEq/L (ref 137–147)
TOTAL PROTEIN: 7.1 g/dL (ref 6.0–8.3)
Total Bilirubin: 0.4 mg/dL (ref 0.3–1.2)

## 2013-11-09 LAB — URINE MICROSCOPIC-ADD ON

## 2013-11-09 LAB — PREGNANCY, URINE: PREG TEST UR: NEGATIVE

## 2013-11-09 LAB — LIPASE, BLOOD: Lipase: 30 U/L (ref 11–59)

## 2013-11-09 MED ORDER — PB-HYOSCY-ATROPINE-SCOPOLAMINE 16.2 MG/5ML PO ELIX
10.0000 mL | ORAL_SOLUTION | Freq: Once | ORAL | Status: DC
  Filled 2013-11-09: qty 10

## 2013-11-09 MED ORDER — ESOMEPRAZOLE MAGNESIUM 40 MG PO CPDR: 40.0000 mg | DELAYED_RELEASE_CAPSULE | Freq: Every day | ORAL | Status: AC

## 2013-11-09 MED ORDER — PANTOPRAZOLE SODIUM 40 MG IV SOLR
40.0000 mg | Freq: Once | INTRAVENOUS | Status: AC
  Administered 2013-11-09: 40 mg via INTRAVENOUS
  Filled 2013-11-09: qty 40

## 2013-11-09 MED ORDER — ONDANSETRON HCL 4 MG/2ML IJ SOLN
4.0000 mg | Freq: Once | INTRAMUSCULAR | Status: AC
  Administered 2013-11-09: 4 mg via INTRAVENOUS
  Filled 2013-11-09: qty 2

## 2013-11-09 MED ORDER — ALUM & MAG HYDROXIDE-SIMETH 200-200-20 MG/5ML PO SUSP
30.0000 mL | Freq: Once | ORAL | Status: AC
  Administered 2013-11-09: 30 mL via ORAL
  Filled 2013-11-09: qty 30

## 2013-11-09 MED ORDER — MORPHINE SULFATE 4 MG/ML IJ SOLN
4.0000 mg | Freq: Once | INTRAMUSCULAR | Status: AC
  Administered 2013-11-09: 4 mg via INTRAVENOUS
  Filled 2013-11-09: qty 1

## 2013-11-09 NOTE — ED Notes (Signed)
Water given to pt for po challenge.

## 2013-11-09 NOTE — ED Provider Notes (Signed)
CSN: [id number]     Arrival date & time 11/09/13  0917 History   First MD Initiated Contact with Patient 11/09/13 [phone]     Chief Complaint  Patient presents with  . Abdominal Pain     (Consider location/radiation/quality/duration/timing/severity/associated sxs/prior Treatment) HPI  This is a 43 year old female who presents with abdominal pain. History of diabetes and reflux. Patient reports onset of symptoms yesterday after eating. Pain is described as burning.  It is mostly in the epigastrium and left upper part her. It does radiate into her chest. Current pain is 8/10. She has associated nausea without vomiting. She had a bowel movement approximately 2 hours ago. She states that this is somewhat consistent with her prior episodes of reflux. Denies any urinary symptoms, fevers, chest pain, or shortness of breath.  Past Medical History  Diagnosis Date  . Diabetes mellitus   . Arthritis   . Gastro-esophageal reflux    Past Surgical History  Procedure Laterality Date  . Tubal ligation     No family history on file. History  Substance Use Topics  . Smoking status: Never Smoker   . Smokeless tobacco: Never Used  . Alcohol Use: No   OB History   Grav Para Term Preterm Abortions TAB SAB Ect Mult Living                 Review of Systems  Constitutional: Negative for fever.  Respiratory: Negative for chest tightness and shortness of breath.   Cardiovascular: Negative for chest pain.  Gastrointestinal: Positive for nausea and abdominal pain. Negative for vomiting.  Genitourinary: Negative for dysuria.  Neurological: Negative for headaches.  All other systems reviewed and are negative.     Allergies  Lidocaine  Home Medications   Prior to Admission medications   Medication Sig Start Date End Date Taking? Authorizing Provider  Canagliflozin (INVOKANA) 300 MG TABS Take by mouth daily.    Historical Provider, MD  esomeprazole (NEXIUM) 40 MG capsule Take 1 capsule (40 mg  total) by mouth daily. 11/09/13   Shon Baton, MD  glipiZIDE-metformin (METAGLIP) 5-500 MG per tablet Take 2 tablets by mouth 2 (two) times daily before a meal.     Historical Provider, MD  losartan (COZAAR) 25 MG tablet Take 25 mg by mouth daily.    Historical Provider, MD  metoprolol succinate (TOPROL-XL) 25 MG 24 hr tablet Take 25 mg by mouth daily.    Historical Provider, MD  terbinafine (LAMISIL) 250 MG tablet TAKE 1 TABLET BY MOUTH EVERY DAY 04/10/13   Myeong Sheard, DPM   BP 134/84  Pulse 64  Temp(Src) 98.1 F (36.7 C) (Oral)  Resp 18  Ht  (1.676 m)  Wt 195 lb (88.451 kg)  BMI 31.49 kg/m2  SpO2 100%  LMP 10/29/2013 Physical Exam  Nursing note and vitals reviewed. Constitutional: She is oriented to person, place, and time. She appears well-developed and well-nourished. No distress.  HENT:  Head: Normocephalic and atraumatic.  Cardiovascular: Normal rate, regular rhythm and normal heart sounds.   Pulmonary/Chest: Effort normal and breath sounds normal. No respiratory distress. She has no wheezes.  Abdominal: Soft. Bowel sounds are normal. There is tenderness. There is no rebound and no guarding.  Mild tenderness to palpation of the epigastrium without rebound or guarding  Musculoskeletal: She exhibits no edema.  Neurological: She is alert and oriented to person, place, and time.  Skin: Skin is warm and dry.  Psychiatric: She has a normal mood and affect.  ED Course  Procedures (including critical care time) Labs Review Labs Reviewed  COMPREHENSIVE METABOLIC PANEL - Abnormal; Notable for the following:    Glucose, Bld 217 (*)    Calcium 11.1 (*)    All other components within normal limits  URINALYSIS, ROUTINE W REFLEX MICROSCOPIC - Abnormal; Notable for the following:    Glucose, UA 100 (*)    Hgb urine dipstick TRACE (*)    All other components within normal limits  CBC WITH DIFFERENTIAL  LIPASE, BLOOD  PREGNANCY, URINE  URINE MICROSCOPIC-ADD ON     Imaging Review No results found.   EKG Interpretation None     Medications  alum & mag hydroxide-simeth (MAALOX/MYLANTA) 200-200-20 MG/5ML suspension 30 mL (30 mLs Oral Given 11/09/13 1045)  morphine 4 MG/ML injection 4 mg (4 mg Intravenous Given 11/09/13 1045)  ondansetron (ZOFRAN) injection 4 mg (4 mg Intravenous Given 11/09/13 1045)  pantoprazole (PROTONIX) injection 40 mg (40 mg Intravenous Given 11/09/13 1102)    MDM   Final diagnoses:  Gastroesophageal reflux disease, esophagitis presence not specified    Patient presents with epigastric and left upper quadrant pain. She is nontoxic on exam. Mild tenderness. Lab work including lipase is reassuring. Patient was given Maalox and protonix as well as zofran and morphine.  Patient reports improvement.  She is tolerating PO.  Repeat exam reassuring.  Suspect Reflux vs PUD.  Will place on daily PPI.  Patient to follow-up with PCP.  After history, exam, and medical workup I feel the patient has been appropriately medically screened and is safe for discharge home. Pertinent diagnoses were discussed with the patient. Patient was given return precautions.     Shon Baton, MD 11/09/13 [phone]

## 2013-11-09 NOTE — ED Notes (Signed)
Pt reports left side abdominal pain since yesterday, pain constant. Nausea, states stool this am was dark green color.

## 2013-11-09 NOTE — Discharge Instructions (Signed)
Gastroesophageal Reflux Disease, Adult Gastroesophageal reflux disease (GERD) happens when acid from your stomach flows up into the esophagus. When acid comes in contact with the esophagus, the acid causes soreness (inflammation) in the esophagus. Over time, GERD may create small holes (ulcers) in the lining of the esophagus. CAUSES   Increased body weight. This puts pressure on the stomach, making acid rise from the stomach into the esophagus.  Smoking. This increases acid production in the stomach.  Drinking alcohol. This causes decreased pressure in the lower esophageal sphincter (valve or ring of muscle between the esophagus and stomach), allowing acid from the stomach into the esophagus.  Late evening meals and a full stomach. This increases pressure and acid production in the stomach.  A malformed lower esophageal sphincter. Sometimes, no cause is found. SYMPTOMS   Burning pain in the lower part of the mid-chest behind the breastbone and in the mid-stomach area. This may occur twice a week or more often.  Trouble swallowing.  Sore throat.  Dry cough.  Asthma-like symptoms including chest tightness, shortness of breath, or wheezing. DIAGNOSIS  Your caregiver may be able to diagnose GERD based on your symptoms. In some cases, X-rays and other tests may be done to check for complications or to check the condition of your stomach and esophagus. TREATMENT  Your caregiver may recommend over-the-counter or prescription medicines to help decrease acid production. Ask your caregiver before starting or adding any new medicines.  HOME CARE INSTRUCTIONS   Change the factors that you can control. Ask your caregiver for guidance concerning weight loss, quitting smoking, and alcohol consumption.  Avoid foods and drinks that make your symptoms worse, such as:  Caffeine or alcoholic drinks.  Chocolate.  Peppermint or mint flavorings.  Garlic and onions.  Spicy foods.  Citrus fruits,  such as oranges, lemons, or limes.  Tomato-based foods such as sauce, chili, salsa, and pizza.  Fried and fatty foods.  Avoid lying down for the 3 hours prior to your bedtime or prior to taking a nap.  Eat small, frequent meals instead of large meals.  Wear loose-fitting clothing. Do not wear anything tight around your waist that causes pressure on your stomach.  Raise the head of your bed 6 to 8 inches with wood blocks to help you sleep. Extra pillows will not help.  Only take over-the-counter or prescription medicines for pain, discomfort, or fever as directed by your caregiver.  Do not take aspirin, ibuprofen, or other nonsteroidal anti-inflammatory drugs (NSAIDs). SEEK IMMEDIATE MEDICAL CARE IF:   You have pain in your arms, neck, jaw, teeth, or back.  Your pain increases or changes in intensity or duration.  You develop nausea, vomiting, or sweating (diaphoresis).  You develop shortness of breath, or you faint.  Your vomit is green, yellow, black, or looks like coffee grounds or blood.  Your stool is red, bloody, or black. These symptoms could be signs of other problems, such as heart disease, gastric bleeding, or esophageal bleeding. MAKE SURE YOU:   Understand these instructions.  Will watch your condition.  Will get help right away if you are not doing well or get worse. Document Released: 12/02/2004 Document Revised: 05/17/2011 Document Reviewed: 09/11/2010 [employer] Patient Information 2015 [employer]. This information is not intended to replace advice given to you by your health care provider. Make sure you discuss any questions you have with your health care provider.  

## 2013-11-09 NOTE — ED Notes (Signed)
Pt able to keep water down, states she feels a little better.
